# Patient Record
Sex: Male | Born: 1955 | Race: Black or African American | Hispanic: No | Marital: Married | Smoking: Former smoker
Health system: Southern US, Community
[De-identification: ages and names within clinical notes are randomized; demographics above are authoritative.]

## PROBLEM LIST (undated history)

## (undated) DIAGNOSIS — I10 Essential (primary) hypertension: Secondary | ICD-10-CM

## (undated) HISTORY — DX: Essential (primary) hypertension: I10

---

## 2000-08-12 ENCOUNTER — Emergency Department (HOSPITAL_COMMUNITY): Admission: EM | Admit: 2000-08-12 | Discharge: 2000-08-12 | Payer: Self-pay | Admitting: Emergency Medicine

## 2000-08-12 ENCOUNTER — Encounter: Payer: Self-pay | Admitting: Emergency Medicine

## 2001-06-28 ENCOUNTER — Emergency Department (HOSPITAL_COMMUNITY): Admission: EM | Admit: 2001-06-28 | Discharge: 2001-06-28 | Payer: Self-pay | Admitting: Emergency Medicine

## 2001-06-29 ENCOUNTER — Encounter: Payer: Self-pay | Admitting: Emergency Medicine

## 2006-06-03 ENCOUNTER — Emergency Department (HOSPITAL_COMMUNITY): Admission: EM | Admit: 2006-06-03 | Discharge: 2006-06-03 | Payer: Self-pay | Admitting: Emergency Medicine

## 2008-02-01 IMAGING — CR DG CHEST 1V PORT
1 series · 1 of 1 positions shown · non-contrast
Comparison: None

CLINICAL DATA: Shortness of breath, chest pain

PORTABLE CHEST - 1 VIEW:

[view not recorded]
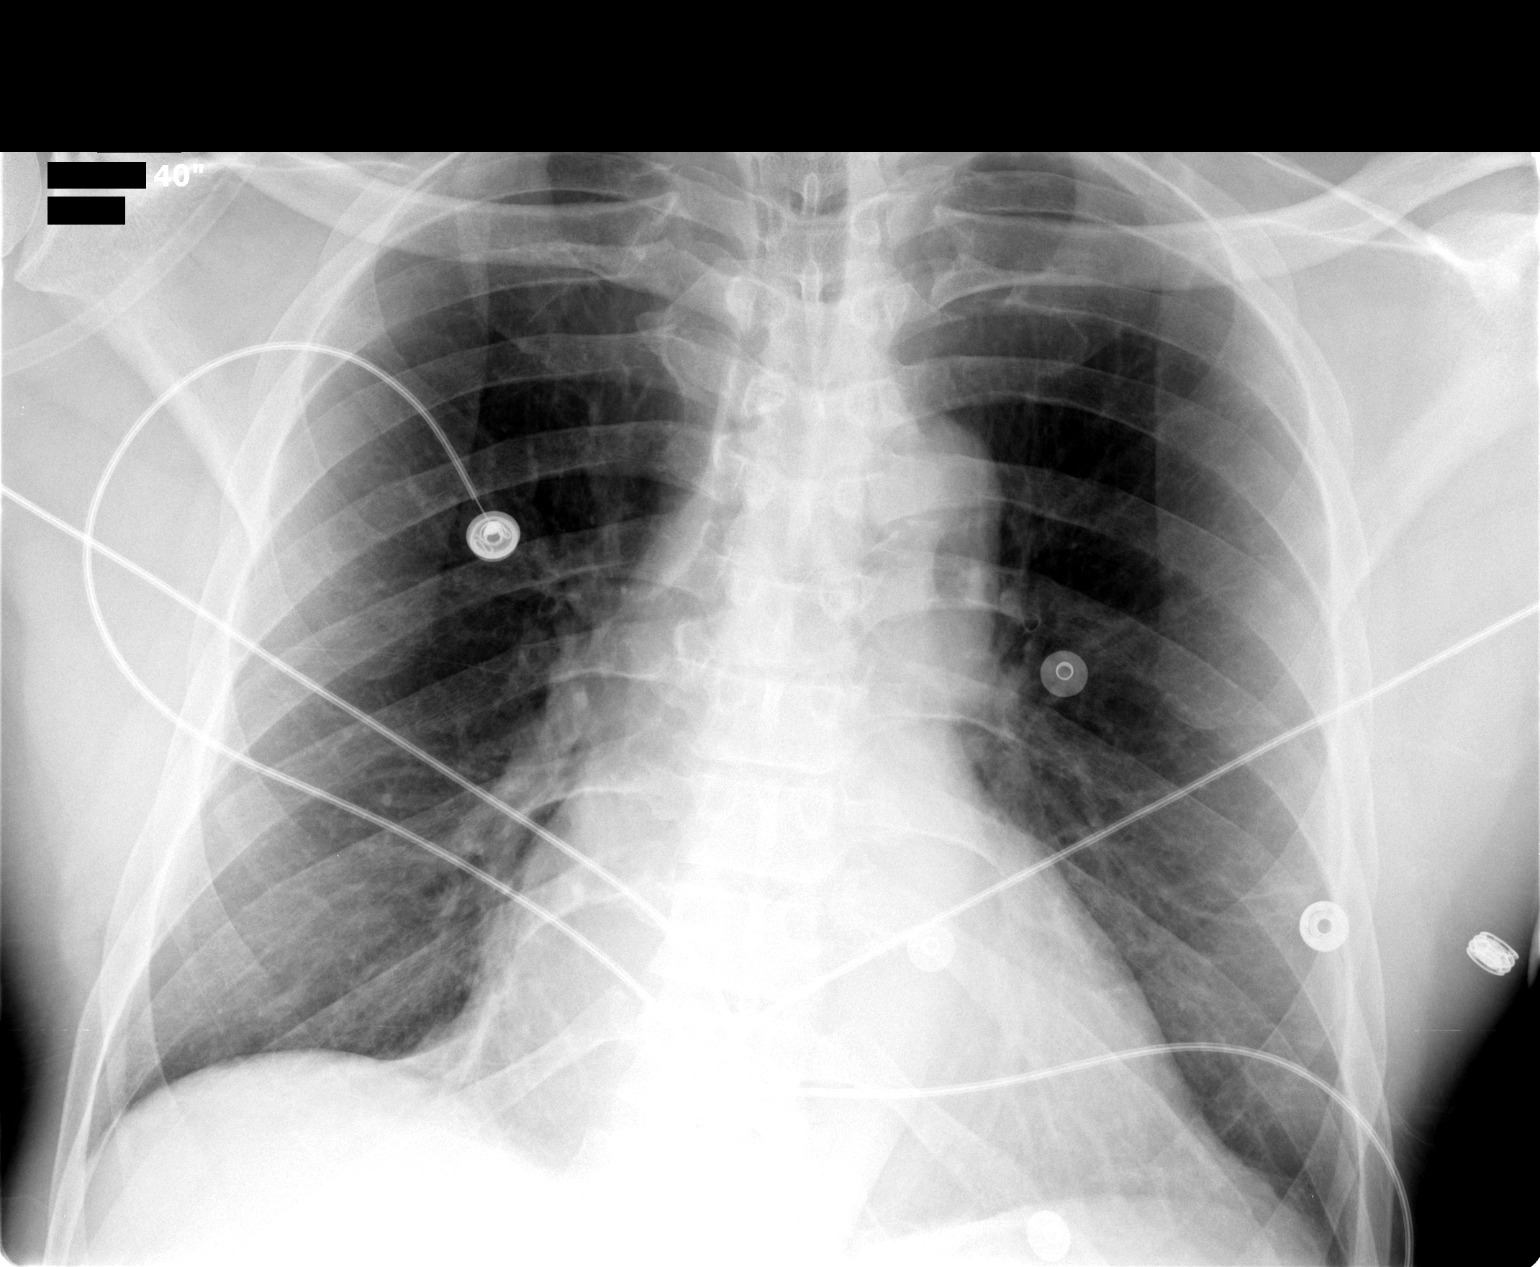

[1 of 1 positions shown; findings below may reference images not displayed]

FINDINGS: There is mild cardiomegaly. No focal airspace opacities or effusions.
Visualized skeleton unremarkable.
IMPRESSION: Mild cardiomegaly. No active disease.

## 2020-10-30 ENCOUNTER — Ambulatory Visit (HOSPITAL_COMMUNITY)
Admission: EM | Admit: 2020-10-30 | Discharge: 2020-10-30 | Disposition: A | Discharge status: Home / Self Care | Payer: Medicare Other | Attending: Family Medicine | Admitting: Family Medicine

## 2020-10-30 ENCOUNTER — Other Ambulatory Visit: Payer: Self-pay

## 2020-10-30 ENCOUNTER — Encounter (HOSPITAL_COMMUNITY): Payer: Self-pay | Admitting: Emergency Medicine

## 2020-10-30 DIAGNOSIS — S41111A Laceration without foreign body of right upper arm, initial encounter: Secondary | ICD-10-CM | POA: Diagnosis not present

## 2020-10-30 DIAGNOSIS — Z23 Encounter for immunization: Secondary | ICD-10-CM

## 2020-10-30 MED ORDER — TETANUS-DIPHTH-ACELL PERTUSSIS 5-2.5-18.5 LF-MCG/0.5 IM SUSY
PREFILLED_SYRINGE | INTRAMUSCULAR | Status: AC
  Filled 2020-10-30: qty 0.5

## 2020-10-30 MED ORDER — LIDOCAINE-EPINEPHRINE 1 %-1:100000 IJ SOLN
INTRAMUSCULAR | Status: AC
  Filled 2020-10-30: qty 1

## 2020-10-30 MED ORDER — TETANUS-DIPHTH-ACELL PERTUSSIS 5-2.5-18.5 LF-MCG/0.5 IM SUSY
0.5000 mL | PREFILLED_SYRINGE | Freq: Once | INTRAMUSCULAR | Status: AC
  Administered 2020-10-30: 0.5 mL via INTRAMUSCULAR

## 2020-10-30 NOTE — ED Triage Notes (Signed)
Pt presents with multiple lacerations on bilateral arms. States approx 1 hour ago, glass broke and cut both arms.   Pt states unsure when last Tdap was.

## 2020-10-30 NOTE — Discharge Instructions (Addendum)
Your sutures will absorb.  You do not need to have them removed.  You can use warm, soapy water to clean your wound.  If you see redness, pus, drainage, increased pain, or fever, you should be seen by a medical provider right away.  Your blood pressure was elevated today.  I recommend following up with your PCP.  If you have worsening headache, vision changes, numbness and tingling in your extremities, new back pain, difficulty urinating, or chest pain, you should be seen by a medical provider right away.

## 2020-10-30 NOTE — ED Provider Notes (Signed)
[facility]    CSN: [id number] Arrival date & time: 10/30/20  1548      History   Chief Complaint Chief Complaint  Patient presents with   Extremity Laceration    HPI Mark Novak is a 65 y.o. male.   Right forearm laceration Patient reports that he works for a [employer] and was lifting a piece of old fiberglass that shattered.  Any small pieces laceration on his forearm that is most concerning to him He reports that this occurred about an hour prior to arrival He is unsure of his last Tdap Denies N/T, loss of hand or finger function, fever He is not currently on any blood thinners  Patient denies a prior history of hypertension, not on any antihypertensives Denies headache, vision changes, chest pain, difficulty breathing, new back pain, difficulty urinating, numbness and tingling in his extremities, focal weakness   History reviewed. No pertinent past medical history.  There are no problems to display for this patient.   History reviewed. No pertinent surgical history.     Home Medications    Prior to Admission medications   Not on File    Family History History reviewed. No pertinent family history.  Social History Social History   Tobacco Use   Smoking status: Former    Types: Cigarettes   Smokeless tobacco: Never  Substance Use Topics   Alcohol use: Not Currently   Drug use: Not Currently     Allergies   Patient has no known allergies.   Review of Systems Review of Systems  Constitutional:  Negative for fever.  HENT:  Negative for congestion.   Eyes:  Negative for visual disturbance.  Respiratory:  Negative for shortness of breath.   Cardiovascular:  Negative for chest pain.  Genitourinary:  Negative for difficulty urinating.  Musculoskeletal:  Negative for back pain.  Skin:  Positive for wound.  Neurological:  Negative for dizziness, syncope, weakness and headaches.    Physical Exam Triage Vital Signs ED Triage Vitals   Enc Vitals Group     BP 10/30/20 1642 (!) 164/91     Pulse Rate 10/30/20 1642 88     Resp 10/30/20 1642 17     Temp 10/30/20 1642 98.4 F (36.9 C)     Temp Source 10/30/20 1642 Oral     SpO2 10/30/20 1642 100 %     Weight --      Height --      Head Circumference --      Peak Flow --      Pain Score 10/30/20 1638 5     Pain Loc --      Pain Edu? --      Excl. in GC? --    No data found.  Updated Vital Signs BP (!) 164/91 (BP Location: Left Arm)   Pulse 88   Temp 98.4 F (36.9 C) (Oral)   Resp 17   SpO2 100%   Visual Acuity Right Eye Distance:   Left Eye Distance:   Bilateral Distance:    Right Eye Near:   Left Eye Near:    Bilateral Near:     Physical Exam Constitutional:      General: He is not in acute distress.    Appearance: Normal appearance. He is not ill-appearing.  HENT:     Head: Normocephalic and atraumatic.  Cardiovascular:     Rate and Rhythm: Normal rate and regular rhythm.  Pulmonary:     Effort: Pulmonary effort is  normal. No respiratory distress.  Musculoskeletal:     Right upper arm: No bony tenderness.     Left upper arm: No bony tenderness.     Right elbow: No deformity. Normal range of motion.     Left elbow: No deformity. Normal range of motion.     Right wrist: Normal range of motion. Normal pulse.     Left wrist: Normal range of motion. Normal pulse.     Right hand: No bony tenderness. Normal range of motion. Normal strength. Normal pulse.     Left hand: No bony tenderness. Normal range of motion. Normal strength. Normal pulse.       Arms:  Neurological:     Mental Status: He is alert.     UC Treatments / Results  Labs (all labs ordered are listed, but only abnormal results are displayed) Labs Reviewed - No data to display  EKG   Radiology No results found.  Procedures Laceration Repair  Date/Time: 10/30/2020 5:32 PM Performed by: Unknown Jim, DO Authorized by: Merrilee Jansky, MD   Consent:     Consent obtained:  Verbal   Consent given by:  Patient   Risks, benefits, and alternatives were discussed: yes     Risks discussed:  Infection, pain, retained foreign body and poor cosmetic result   Alternatives discussed:  No treatment Universal protocol:    Patient identity confirmed:  Verbally with patient and arm band Anesthesia:    Anesthesia method:  Local infiltration   Local anesthetic:  Lidocaine 1% WITH epi Laceration details:    Location:  Shoulder/arm   Shoulder/arm location:  R lower arm   Length (cm):  0.5   Depth (mm):  1 Pre-procedure details:    Preparation:  Patient was prepped and draped in usual sterile fashion Exploration:    Wound exploration: entire depth of wound visualized     Wound extent: no fascia violation noted, no foreign bodies/material noted, no nerve damage noted and no tendon damage noted     Contaminated: no   Treatment:    Area cleansed with:  Saline   Amount of cleaning:  Standard   Irrigation solution:  Sterile saline   Irrigation method:  Syringe   Visualized foreign bodies/material removed: yes (two small pieces of glass)     Debridement:  None   Undermining:  None Skin repair:    Repair method:  Sutures   Suture size:  4-0   Wound skin closure material used: vicryl.   Suture technique:  Simple interrupted   Number of sutures:  2 Approximation:    Approximation:  Close Repair type:    Repair type:  Simple Post-procedure details:    Dressing:  Non-adherent dressing   Procedure completion:  Tolerated (including critical care time)  Medications Ordered in UC Medications  Tdap (BOOSTRIX) injection 0.5 mL (0.5 mLs Intramuscular Given 10/30/20 1725)    Initial Impression / Assessment and Plan / UC Course  I have reviewed the triage vital signs and the nursing notes.  Pertinent labs & imaging results that were available during my care of the patient were reviewed by me and considered in my medical decision making (see chart for  details).     Patient is a 65 yo male with few superficial abrasions and one laceration on right forearm extending to subcutaneous tissue which could have benefit from suture.  Discussed risk and benefits with patient and he would like to proceed with suturing of this laceration.  All  tendons of wrist and hands bilaterally are intact.  2 small pieces of glass removed from superficial lacerations in the right forearm.  Suturing performed per above.  Advised patient to monitor for signs of infection including fever, worsening pain, discharge, redness.  He was advised that there may be some glass left in his arm, but should improve with gentle cleansing and will eventually work its way out.  In regards to his hypertension, he is currently asymptomatic.  Recommended follow-up with his PCP.  ED precautions given if he becomes symptomatic.  He was given a Tdap in urgent care.  Discharged home.   Final Clinical Impressions(s) / UC Diagnoses   Final diagnoses:  Laceration of right upper extremity, initial encounter     Discharge Instructions      Your sutures will absorb.  You do not need to have them removed.  You can use warm, soapy water to clean your wound.  If you see redness, pus, drainage, increased pain, or fever, you should be seen by a medical provider right away.  Your blood pressure was elevated today.  I recommend following up with your PCP.  If you have worsening headache, vision changes, numbness and tingling in your extremities, new back pain, difficulty urinating, or chest pain, you should be seen by a medical provider right away.     ED Prescriptions   None    PDMP not reviewed this encounter.   Unknown Jim, DO 10/30/20 1736

## 2021-01-24 ENCOUNTER — Ambulatory Visit (INDEPENDENT_AMBULATORY_CARE_PROVIDER_SITE_OTHER): Payer: Medicare Other

## 2021-01-24 ENCOUNTER — Encounter (HOSPITAL_COMMUNITY): Payer: Self-pay | Admitting: Emergency Medicine

## 2021-01-24 ENCOUNTER — Other Ambulatory Visit: Payer: Self-pay

## 2021-01-24 ENCOUNTER — Ambulatory Visit (HOSPITAL_COMMUNITY)
Admission: EM | Admit: 2021-01-24 | Discharge: 2021-01-24 | Disposition: A | Discharge status: Home / Self Care | Payer: Medicare Other | Attending: Emergency Medicine | Admitting: Emergency Medicine

## 2021-01-24 DIAGNOSIS — S50851D Superficial foreign body of right forearm, subsequent encounter: Secondary | ICD-10-CM | POA: Diagnosis not present

## 2021-01-24 MED ORDER — IBUPROFEN 600 MG PO TABS: 600.0000 mg | ORAL_TABLET | Freq: Four times a day (QID) | ORAL | 0 refills | Status: DC | PRN

## 2021-01-24 NOTE — ED Triage Notes (Addendum)
Patient was seen in July 2022 for an injury to right arm  Today patient is in department and concerned about pain in right wrist and has a "suture",  remains exposed.  Has sharp pain in right wrist.

## 2021-01-24 NOTE — Discharge Instructions (Addendum)
I have put in referral to [place] [facility], however, give them a call ASAP and set up an appointment.  You have a shard of glass measuring 0.8 x 0.6 cm with surrounding soft tissue swelling.  This may work its way out, however, it may need to be removed if it continues to cause severe pain.  Below is a list of primary care practices who are taking new patients for you to follow-up with.  [facility] internal medicine clinic Ground Floor - [facility], [address] [place], [place], [place] [address] [phone]  [facility] Care at [facility] [address] [place], [place] [address] [phone]  [facility] [address] [place], [place] [address] [phone]  [facility] [phone] [address] [place] [address]  [facility]: [address] [place] [address]  [phone]  [facility] Family Medicine: [address] [place] [address]  [phone]  [facility] : [address] [place] [address] [phone]  [facility] Primary Care: [address] [place] [address] [phone]  [facility]: [address] [place] [address] [phone]  Dr. Oneal Grout [address] [facility] [place] [address]  [phone]  Go to www.goodrx.com  or www.costplusdrugs.com to look up your medications. This will give you a list of where you can find your prescriptions at the most affordable prices. Or ask the pharmacist what the cash price is, or if they have any other discount programs available to help make your medication more affordable. This can be less expensive than what you would pay with insurance.

## 2021-01-24 NOTE — ED Provider Notes (Signed)
HPI  SUBJECTIVE:  Mark Novak is a right-handed 65 y.o. male who presents with persistent pain, swelling, foreign body sensation in his right forearm since sustaining a laceration in July with some glass. Patient was seen here on 7/19 for this.  2 small pieces of glass were removed from largest laceration.  It was closed with 2 dissolvable sutures.  He states that his forearm at the laceration site has been sore since the incident, and he reports pain with wrist flexion/extension.  He states that he is having less pain at the scar site, but it is present with palpation.  No erythema, purulent drainage.  No fevers.  No alleviating factors.  He has not tried anything for this.  Symptoms worse with palpation.  He has no past medical history.  PMD: None.  History reviewed. No pertinent past medical history.  History reviewed. No pertinent surgical history.  History reviewed. No pertinent family history.  Social History   Tobacco Use   Smoking status: Former    Types: Cigarettes   Smokeless tobacco: Never  Vaping Use   Vaping Use: Never used  Substance Use Topics   Alcohol use: Not Currently   Drug use: Not Currently    No current facility-administered medications for this encounter.  Current Outpatient Medications:    ibuprofen (ADVIL) 600 MG tablet, Take 1 tablet (600 mg total) by mouth every 6 (six) hours as needed., Disp: 30 tablet, Rfl: 0   Multiple Vitamin (MULTIVITAMIN) capsule, Take 1 capsule by mouth daily., Disp: , Rfl:   No Known Allergies   ROS  As noted in HPI.   Physical Exam  BP (!) 148/90 (BP Location: Left Arm)   Pulse 70   Temp 98 F (36.7 C) (Oral)   Resp 20   SpO2 99%   Constitutional: Well developed, well nourished, no acute distress Eyes:  EOMI, conjunctiva normal bilaterally HENT: Normocephalic, atraumatic,mucus membranes moist Respiratory: Normal inspiratory effort Cardiovascular: Normal rate GI: nondistended skin: Healed, tender, hyperpigmented  scar with a single suture protruding from the surface distal right upper extremity.  No expressible purulent drainage.  No erythema.  Positive tender surrounding hard tissue.  No central fluctuance.      Musculoskeletal: no deformities.  Pain with flexion extension of the wrist.  No pain with radial/ulnar deviation, supination/pronation.  RP 2+.  Sensation distally grossly intact. Neurologic: Alert & oriented x 3, no focal neuro deficits Psychiatric: Speech and behavior appropriate   ED Course   Medications - No data to display  Orders Placed This Encounter  Procedures   DG Forearm Right    Standing Status:   Standing    Number of Occurrences:   1    Order Specific Question:   Reason for Exam (SYMPTOM  OR DIAGNOSIS REQUIRED)    Answer:   Laceration with glass in June distal forearm, 2 shards removed.  Rule out retained foreign body.   Ambulatory referral to General Surgery    Referral Priority:   Routine    Referral Type:   Surgical    Referral Reason:   Specialty Services Required    Referred to Provider:   [facility]    Requested Specialty:   General Surgery    Number of Visits Requested:   1    No results found for this or any previous visit (from the past 24 hour(s)). DG Forearm Right  Result Date: 01/24/2021 CLINICAL DATA:  Laceration with glass in June distal forearm, 2 shards removed. Rule  out retained foreign body. EXAM: RIGHT FOREARM - 2 VIEW COMPARISON:  None. FINDINGS: There is no evidence of acute fracture. There is a radiopaque density overlying the volar lateral soft tissues of the distal forearm which measures 0.8 x 0.6 cm. Adjacent soft tissue swelling. Mild elbow arthritis. Mild wrist arthritis. IMPRESSION: Radiopaque density measuring 0.8 x 0.6 cm overlying the volar lateral soft tissues of the distal forearm, consistent with foreign body. Adjacent soft tissue swelling. Electronically Signed   By: Caprice Renshaw M.D.   On: 01/24/2021 12:27    ED  Clinical Impression  1. Foreign body in right forearm, subsequent encounter      ED Assessment/Plan  Previous records reviewed.  As noted in HPI.  Removed protruding suture in its entirety with fingers.  There is no expressible purulent drainage.  He has tender hard mass surrounding the scar.  Suspect scarring versus cyst.  Concern for retained foreign body.  Will x-ray right forearm to rule out any retained foreign body.  Discussed with patient and wife that glass may not show up on x-ray.  Discussed with patient and spouse that usually, foreign bodies work their way out and that removal is not indicated unless it is causing significant pain/disability.  Reviewed imaging independently.  Radiopaque density measuring 0.8 x 0.6 cm with adjacent soft tissue swelling. sere radiology report for full details.  Will refer to surgery for definitive evaluation and treatment.  Can try Tylenol/ibuprofen as needed for pain.  Also providing primary care list for ongoing routine care.  Ordering assistance in finding a PMD.  Discussed  imaging, MDM, treatment plan, and plan for follow-up with patient. Discussed sn/sx that should prompt return to the ED. patient agrees with plan.   Meds ordered this encounter  Medications   ibuprofen (ADVIL) 600 MG tablet    Sig: Take 1 tablet (600 mg total) by mouth every 6 (six) hours as needed.    Dispense:  30 tablet    Refill:  0       *This clinic note was created using Scientist, clinical (histocompatibility and immunogenetics). Therefore, there may be occasional mistakes despite careful proofreading.  ?    Domenick Gong, MD 01/25/21 [phone]

## 2021-05-12 ENCOUNTER — Other Ambulatory Visit: Payer: Self-pay

## 2021-05-12 ENCOUNTER — Ambulatory Visit (HOSPITAL_COMMUNITY)
Admission: EM | Admit: 2021-05-12 | Discharge: 2021-05-12 | Disposition: A | Discharge status: Home / Self Care | Payer: Medicare PPO | Attending: Family Medicine | Admitting: Family Medicine

## 2021-05-12 ENCOUNTER — Encounter (HOSPITAL_COMMUNITY): Payer: Self-pay | Admitting: *Deleted

## 2021-05-12 DIAGNOSIS — I10 Essential (primary) hypertension: Secondary | ICD-10-CM | POA: Diagnosis not present

## 2021-05-12 DIAGNOSIS — M542 Cervicalgia: Secondary | ICD-10-CM

## 2021-05-12 MED ORDER — LISINOPRIL 10 MG PO TABS: 10.0000 mg | ORAL_TABLET | Freq: Every day | ORAL | 0 refills | Status: DC

## 2021-05-12 MED ORDER — TIZANIDINE HCL 4 MG PO TABS: 4.0000 mg | ORAL_TABLET | Freq: Three times a day (TID) | ORAL | 0 refills | Status: DC | PRN

## 2021-05-12 MED ORDER — IBUPROFEN 800 MG PO TABS: 800.0000 mg | ORAL_TABLET | Freq: Three times a day (TID) | ORAL | 0 refills | Status: DC | PRN

## 2021-05-12 NOTE — ED Triage Notes (Signed)
Pt reports at church today he had his BP checked and it was 168/91. Pt is not on BP meds . Pt does not have a PCP.

## 2021-05-12 NOTE — Discharge Instructions (Addendum)
Lisinopril 10 mg 1 daily for blood pressure.  This medicine can cause a dry cough.  Take ibuprofen 800 mg 1 every 8 hours as needed for pain.  You can also take tizanidine 4 mg 1 every 8 hours as needed for muscle spasm and muscle pain.  This medication can make you sleepy   Goal for blood pressure treatment is to have the blood pressure be less than 140 on the top and less than 90 on the bottom

## 2021-05-12 NOTE — ED Provider Notes (Signed)
[facility]    CSN: [id number] Arrival date & time: 05/12/21  1201      History   Chief Complaint Chief Complaint  Patient presents with   Hypertension    HPI Mark Novak is a 66 y.o. male.    Hypertension  Here for elevated blood pressure noted today when his blood pressure was checked at church.  Someone checked his blood pressure because he was having some right-sided chest and neck and shoulder pain.  The blood pressure there was in the 160s over 90s. Here it is 162/78.  He has not had much headache.  The right-sided shoulder pain has been bothering him in the last 2 or 3 days.  It hurts worse when he moves his arm about his shoulder or moves his head and turns his head.  No fever or chills.  He reports his blood pressure was elevated when he was here previously for an injury.  He does have some numbness in his right fourth and fifth digits of the hand.  He expresses concern about the retained glass in his right forearm since the injury in July 2022.    History reviewed. No pertinent past medical history.  There are no problems to display for this patient.   History reviewed. No pertinent surgical history.     Home Medications    Prior to Admission medications   Medication Sig Start Date End Date Taking? Authorizing Provider  ibuprofen (ADVIL) 800 MG tablet Take 1 tablet (800 mg total) by mouth every 8 (eight) hours as needed (pain). 05/12/21  Yes Zenia ResidesBanister, Myla Mauriello K, MD  lisinopril (ZESTRIL) 10 MG tablet Take 1 tablet (10 mg total) by mouth daily. 05/12/21  Yes Eilam Shrewsbury, Janace ArisPamela K, MD  tiZANidine (ZANAFLEX) 4 MG tablet Take 1 tablet (4 mg total) by mouth every 8 (eight) hours as needed for muscle spasms. 05/12/21  Yes Zenia ResidesBanister, Arrie Borrelli K, MD  Multiple Vitamin (MULTIVITAMIN) capsule Take 1 capsule by mouth daily.    [provider]    Family History History reviewed. No pertinent family history.  Social History Social History   Tobacco Use    Smoking status: Former    Types: Cigarettes   Smokeless tobacco: Never  Vaping Use   Vaping Use: Never used  Substance Use Topics   Alcohol use: Not Currently   Drug use: Not Currently     Allergies   Patient has no known allergies.   Review of Systems Review of Systems   Physical Exam Triage Vital Signs ED Triage Vitals  Enc Vitals Group     BP 05/12/21 1245 (!) 162/78     Pulse Rate 05/12/21 1245 70     Resp 05/12/21 1245 18     Temp 05/12/21 1245 98.2 F (36.8 C)     Temp src --      SpO2 05/12/21 1245 96 %     Weight --      Height --      Head Circumference --      Peak Flow --      Pain Score 05/12/21 1243 10     Pain Loc --      Pain Edu? --      Excl. in GC? --    No data found.  Updated Vital Signs BP (!) 162/78    Pulse 70    Temp 98.2 F (36.8 C)    Resp 18    SpO2 96%   Visual Acuity Right Eye Distance:  Left Eye Distance:   Bilateral Distance:    Right Eye Near:   Left Eye Near:    Bilateral Near:     Physical Exam Vitals reviewed.  Constitutional:      General: He is not in acute distress.    Appearance: He is not toxic-appearing.  Eyes:     Extraocular Movements: Extraocular movements intact.     Pupils: Pupils are equal, round, and reactive to light.  Cardiovascular:     Rate and Rhythm: Normal rate and regular rhythm.     Heart sounds: No murmur heard. Pulmonary:     Effort: Pulmonary effort is normal.     Breath sounds: Normal breath sounds.  Musculoskeletal:     Cervical back: Neck supple. Tenderness (right trap) present.  Lymphadenopathy:     Cervical: No cervical adenopathy.  Skin:    Capillary Refill: Capillary refill takes less than 2 seconds.     Coloration: Skin is not jaundiced or pale.  Neurological:     General: No focal deficit present.     Mental Status: He is alert.  Psychiatric:        Behavior: Behavior normal.     UC Treatments / Results  Labs (all labs ordered are listed, but only abnormal results  are displayed) Labs Reviewed - No data to display  EKG   Radiology No results found.  Procedures Procedures (including critical care time)  Medications Ordered in UC Medications - No data to display  Initial Impression / Assessment and Plan / UC Course  I have reviewed the triage vital signs and the nursing notes.  Pertinent labs & imaging results that were available during my care of the patient were reviewed by me and considered in my medical decision making (see chart for details).     Discussed that his blood pressure could be elevated at least in part to his pain.  I will begin lisinopril 10 mg.  Discussed at some length that I think his symptoms are related to his neck and his muscle pain.  Discussed also that I would recommend he leave the glass alone, but he feels that it hurts him and causes numbness and tingling when he accidentally hits it when he is doing his work.  He does not have a PCP; I will give him information on finding 1 Final Clinical Impressions(s) / UC Diagnoses   Final diagnoses:  Essential hypertension  Neck pain on right side     Discharge Instructions      Lisinopril 10 mg 1 daily for blood pressure.  This medicine can cause a dry cough.  Take ibuprofen 800 mg 1 every 8 hours as needed for pain.  You can also take tizanidine 4 mg 1 every 8 hours as needed for muscle spasm and muscle pain.  This medication can make you sleepy   Goal for blood pressure treatment is to have the blood pressure be less than 140 on the top and less than 90 on the bottom     ED Prescriptions     Medication Sig Dispense Auth. Provider   ibuprofen (ADVIL) 800 MG tablet Take 1 tablet (800 mg total) by mouth every 8 (eight) hours as needed (pain). 21 tablet Chantalle Defilippo, Janace Aris, MD   tiZANidine (ZANAFLEX) 4 MG tablet Take 1 tablet (4 mg total) by mouth every 8 (eight) hours as needed for muscle spasms. 30 tablet Zenia Resides, MD   lisinopril (ZESTRIL) 10 MG tablet  Take 1 tablet (10  mg total) by mouth daily. 30 tablet Natayla Cadenhead, Janace Aris, MD      PDMP not reviewed this encounter.   Zenia Resides, MD 05/12/21 1321

## 2021-05-23 ENCOUNTER — Ambulatory Visit
Admission: RE | Admit: 2021-05-23 | Discharge: 2021-05-23 | Disposition: A | Discharge status: Home / Self Care | Payer: Medicare PPO | Source: Ambulatory Visit | Attending: Internal Medicine | Admitting: Internal Medicine

## 2021-05-23 ENCOUNTER — Other Ambulatory Visit: Payer: Self-pay | Admitting: Internal Medicine

## 2021-05-23 DIAGNOSIS — R0609 Other forms of dyspnea: Secondary | ICD-10-CM

## 2021-06-07 ENCOUNTER — Institutional Professional Consult (permissible substitution): Payer: Medicare PPO | Admitting: Pulmonary Disease

## 2021-06-10 ENCOUNTER — Other Ambulatory Visit: Payer: Self-pay

## 2021-06-10 ENCOUNTER — Encounter: Payer: Self-pay | Admitting: Cardiology

## 2021-06-10 ENCOUNTER — Ambulatory Visit: Payer: Medicare PPO | Admitting: Cardiology

## 2021-06-10 VITALS — BP 144/81 | HR 71 | Temp 96.7°F | Resp 17 | Ht 79.0 in | Wt 242.4 lb

## 2021-06-10 DIAGNOSIS — I10 Essential (primary) hypertension: Secondary | ICD-10-CM

## 2021-06-10 DIAGNOSIS — R0609 Other forms of dyspnea: Secondary | ICD-10-CM

## 2021-06-10 DIAGNOSIS — R0683 Snoring: Secondary | ICD-10-CM

## 2021-06-10 DIAGNOSIS — E782 Mixed hyperlipidemia: Secondary | ICD-10-CM

## 2021-06-10 MED ORDER — ROSUVASTATIN CALCIUM 10 MG PO TABS: 20.0000 mg | ORAL_TABLET | Freq: Every day | ORAL | 0 refills | Status: DC

## 2021-06-10 NOTE — Progress Notes (Signed)
Patient referred by Audley Hose, MD for dyspnea on exertion, murmur  Subjective:   Mark Novak, male    DOB: 07/24/1955, 66 y.o.   MRN: [id number]   Chief Complaint  Patient presents with   New Patient (Initial Visit)   Heart Murmur   DOE       HPI  66 y.o. African American male with hypertension, hyperlipidemia, mitral valve, referred for dyspnea on exertion, murmur  Patient is here with his wife today. He runs a glassware business. He has noticed that he gets winded more easily than before.  He denies any chest pain, PND, edema symptoms.  He has been told to have cardiac murmur in the past.  Personally reviewed and independently interpreted external labs.   Past Medical History:  Diagnosis Date   Hypertension      History reviewed. No pertinent surgical history.   Social History   Tobacco Use  Smoking Status Former   Types: Cigarettes  Smokeless Tobacco Never    Social History   Substance and Sexual Activity  Alcohol Use Not Currently     Family History  Problem Relation Age of Onset   Dementia Mother       Current Outpatient Medications:    ibuprofen (ADVIL) 800 MG tablet, Take 1 tablet (800 mg total) by mouth every 8 (eight) hours as needed (pain)., Disp: 21 tablet, Rfl: 0   lisinopril (ZESTRIL) 10 MG tablet, Take 1 tablet (10 mg total) by mouth daily., Disp: 30 tablet, Rfl: 0   Multiple Vitamin (MULTIVITAMIN) capsule, Take 1 capsule by mouth daily., Disp: , Rfl:    tiZANidine (ZANAFLEX) 4 MG tablet, Take 1 tablet (4 mg total) by mouth every 8 (eight) hours as needed for muscle spasms., Disp: 30 tablet, Rfl: 0   Cardiovascular and other pertinent studies:  Reviewed external labs and tests, independently interpreted  EKG 06/10/2021: Sinus rhythm 70 bpm Occasional PAC     Recent labs: 05/23/2021: Glucose 105, BUN/Cr 13/1.24. EGFR 64. Na/K 139/4.1. Rest of the CMP normal H/H 13/39. MCV 86. Platelets 171 HbA1C 6.2% Chol 238, TG 90,  HDL 49, LDL 169 TSH 2.1 normal    Review of Systems  Cardiovascular:  Positive for dyspnea on exertion. Negative for chest pain, leg swelling, palpitations and syncope.        Vitals:   06/10/21 1034 06/10/21 1035  BP: (!) 155/82 (!) 144/81  Pulse: 79 71  Resp: 17   Temp: (!) 96.7 F (35.9 C)   SpO2: 100% 99%    Orthostatic vital Body mass index is 27.31 kg/m. Filed Weights   06/10/21 1034  Weight: 242 lb 6.4 oz (110 kg)     Objective:   Physical Exam Vitals and nursing note reviewed.  Constitutional:      General: He is not in acute distress. Neck:     Vascular: No JVD.  Cardiovascular:     Rate and Rhythm: Normal rate and regular rhythm.     Heart sounds: Murmur heard.  High-pitched blowing mid to late systolic murmur is present with a grade of 3/6 at the apex.  Pulmonary:     Effort: Pulmonary effort is normal.     Breath sounds: Normal breath sounds. No wheezing or rales.  Musculoskeletal:     Right lower leg: No edema.     Left lower leg: No edema.         Visit diagnoses:   ICD-10-CM   1. DOE (dyspnea on exertion)  R06.09 EKG 12-Lead       Orders Placed This Encounter  Procedures   Lipid panel   Ambulatory referral to Sleep Studies   EKG 12-Lead   PCV ECHOCARDIOGRAM COMPLETE    Medication changes this visit: Meds ordered this encounter  Medications   rosuvastatin (CRESTOR) 10 MG tablet    Sig: Take 2 tablets (20 mg total) by mouth daily.    Dispense:  1 tablet    Refill:  0     Assessment & Recommendations:   66 y.o. African American male with hypertension, hyperlipidemia, mitral valve, referred for dyspnea on exertion, murmur  Dyspnea exertion: Abnormal suggestive of mitral regurgitation. Check echocardiogram.  Based on echocardiogram, I will make further recommendations.  Hypertension: Suboptimal control.  Strong history suggestive of obstructive sleep apnea. Refer for sleep study. Continue current antihypertensive  therapy.  Hyperlipidemia: LDL 169 mg on Crestor 10 mg daily.  Increase Crestor to 20 mg daily. Repeat lipid panel in 3 months.   Thank you for referring the patient to [place]. Please feel free to contact with any questions.   [name], MD Pager: [phone] Office: [phone]

## 2021-06-11 ENCOUNTER — Encounter: Payer: Self-pay | Admitting: Pulmonary Disease

## 2021-06-11 ENCOUNTER — Ambulatory Visit: Payer: Medicare PPO

## 2021-06-11 ENCOUNTER — Ambulatory Visit: Payer: Medicare PPO | Admitting: Pulmonary Disease

## 2021-06-11 VITALS — BP 128/66 | HR 74 | Temp 97.7°F | Ht 79.0 in | Wt 240.0 lb

## 2021-06-11 DIAGNOSIS — R0602 Shortness of breath: Secondary | ICD-10-CM | POA: Diagnosis not present

## 2021-06-11 DIAGNOSIS — Z87891 Personal history of nicotine dependence: Secondary | ICD-10-CM

## 2021-06-11 DIAGNOSIS — I10 Essential (primary) hypertension: Secondary | ICD-10-CM

## 2021-06-11 LAB — COMPREHENSIVE METABOLIC PANEL
ALT: 19 U/L (ref 0–53)
AST: 21 U/L (ref 0–37)
Albumin: 4.8 g/dL (ref 3.5–5.2)
Alkaline Phosphatase: 57 U/L (ref 39–117)
BUN: 10 mg/dL (ref 6–23)
CO2: 28 mEq/L (ref 19–32)
Calcium: 10.3 mg/dL (ref 8.4–10.5)
Chloride: 104 mEq/L (ref 96–112)
Creatinine, Ser: 1.17 mg/dL (ref 0.40–1.50)
GFR: 64.44 mL/min (ref >60.00)
Glucose, Bld: 118 mg/dL — ABNORMAL HIGH (ref 70–99)
Potassium: 3.7 mEq/L (ref 3.5–5.1)
Sodium: 140 mEq/L (ref 135–145)
Total Bilirubin: 0.4 mg/dL (ref 0.2–1.2)
Total Protein: 7.4 g/dL (ref 6.0–8.3)

## 2021-06-11 LAB — CBC WITH DIFFERENTIAL/PLATELET
Basophils Absolute: 0 10*3/uL (ref 0.0–0.1)
Basophils Relative: 1.1 % (ref 0.0–3.0)
Eosinophils Absolute: 0.2 10*3/uL (ref 0.0–0.7)
Eosinophils Relative: 4 % (ref 0.0–5.0)
HCT: 35.7 % — ABNORMAL LOW (ref 39.0–52.0)
Hemoglobin: 12.3 g/dL — ABNORMAL LOW (ref 13.0–17.0)
Lymphocytes Relative: 28.6 % (ref 12.0–46.0)
Lymphs Abs: 1.3 10*3/uL (ref 0.7–4.0)
MCHC: 34.4 g/dL (ref 30.0–36.0)
MCV: 85.8 fl (ref 78.0–100.0)
Monocytes Absolute: 0.3 10*3/uL (ref 0.1–1.0)
Monocytes Relative: 7.8 % (ref 3.0–12.0)
Neutro Abs: 2.6 10*3/uL (ref 1.4–7.7)
Neutrophils Relative %: 58.5 % (ref 43.0–77.0)
Platelets: 169 10*3/uL (ref 150.0–400.0)
RBC: 4.16 Mil/uL — ABNORMAL LOW (ref 4.22–5.81)
RDW: 14.7 % (ref 11.5–15.5)
WBC: 4.4 10*3/uL (ref 4.0–10.5)

## 2021-06-11 MED ORDER — TRELEGY ELLIPTA 200-62.5-25 MCG/ACT IN AEPB: 1.0000 | INHALATION_SPRAY | Freq: Every day | RESPIRATORY_TRACT | 0 refills | Status: AC

## 2021-06-11 MED ORDER — TRELEGY ELLIPTA 200-62.5-25 MCG/ACT IN AEPB: 1.0000 | INHALATION_SPRAY | Freq: Every day | RESPIRATORY_TRACT | 2 refills | Status: DC

## 2021-06-11 NOTE — Patient Instructions (Signed)
We will get some labs today including CBC differential, IgE, alpha-1 antitrypsin levels and phenotype and CMP Start Trelegy 200 inhaler Schedule PFTs and referral for low-dose screening CT chest  Follow-up in 2 to 3 months

## 2021-06-11 NOTE — Progress Notes (Signed)
Mark Novak    VB:[id number]    [birth year]  Primary Care Physician:Bakare, Larey Dresser, MD  Referring Physician: Audley Hose, MD [name],  [name] [id number]  Chief complaint: Consult for dyspnea  HPI: 66 year old with hypertension, hyperlipidemia, heart murmur Complains of dyspnea which has been going on for 2 years.  He has symptoms with rest and exertion.  Associated with chest tightness and occasional wheeze.  He recently saw Dr. Virgina Jock, cardiology and had an echocardiogram for evaluation of murmur and sleep study ordered due to symptoms of snoring and witnessed apneas at night  Pets: Dogs Occupation: Installs glass in houses Exposures: No mold, hot tub, Jacuzzi.  No feather pillows or comforters Smoking history: 25-pack-year smoker.  Quit in 2021 Travel history: Originally from [place].  No significant travel history Relevant family history: Son has asthma  Outpatient Encounter Medications as of 06/11/2021  Medication Sig   albuterol (VENTOLIN HFA) 108 (90 Base) MCG/ACT inhaler Inhale 1 puff into the lungs daily as needed.   ibuprofen (ADVIL) 800 MG tablet Take 1 tablet (800 mg total) by mouth every 8 (eight) hours as needed (pain).   losartan-hydrochlorothiazide (HYZAAR) 100-12.5 MG tablet Take 1 tablet by mouth daily.   Multiple Vitamin (MULTIVITAMIN) capsule Take 1 capsule by mouth daily.   rosuvastatin (CRESTOR) 10 MG tablet Take 2 tablets (20 mg total) by mouth daily.   No facility-administered encounter medications on file as of 06/11/2021.    Allergies as of 06/11/2021   (No Known Allergies)    Past Medical History:  Diagnosis Date   Hypertension     No past surgical history on file.  Family History  Problem Relation Age of Onset   Dementia Mother     Social History   Socioeconomic History   Marital status: Married    Spouse name: Not on file   Number of children: 1   Years of education: Not on file    Highest education level: Not on file  Occupational History   Not on file  Tobacco Use   Smoking status: Former    Packs/day: 0.25    Years: 50.00    Pack years: 12.50    Types: Cigarettes    Quit date: 2020    Years since quitting: 3.1    Passive exposure: Past   Smokeless tobacco: Never  Vaping Use   Vaping Use: Never used  Substance and Sexual Activity   Alcohol use: Yes    Comment: does not drink currently but did 20 years ago   Drug use: Not Currently    Types: "Crack" cocaine, Cocaine    Comment: heroin ABOUT 20 YEARS AGO   Sexual activity: Not on file  Other Topics Concern   Not on file  Social History Narrative   Not on file   Social Determinants of Health   Financial Resource Strain: Not on file  Food Insecurity: Not on file  Transportation Needs: Not on file  Physical Activity: Not on file  Stress: Not on file  Social Connections: Not on file  Intimate Partner Violence: Not on file    Review of systems: Review of Systems  Constitutional: Negative for fever and chills.  HENT: Negative.   Eyes: Negative for blurred vision.  Respiratory: as per HPI  Cardiovascular: Negative for chest pain and palpitations.  Gastrointestinal: Negative for vomiting, diarrhea, blood per rectum. Genitourinary: Negative for dysuria, urgency, frequency and hematuria.  Musculoskeletal: Negative  for myalgias, back pain and joint pain.  Skin: Negative for itching and rash.  Neurological: Negative for dizziness, tremors, focal weakness, seizures and loss of consciousness.  Endo/Heme/Allergies: Negative for environmental allergies.  Psychiatric/Behavioral: Negative for depression, suicidal ideas and hallucinations.  All other systems reviewed and are negative.  Physical Exam: Blood pressure 128/66, pulse 74, temperature 97.7 F (36.5 C), temperature source Oral, height 6\' 7"  (2.007 m), weight 240 lb (108.9 kg), SpO2 100 %. Gen:      No acute distress HEENT:  EOMI, sclera  anicteric Neck:     No masses; no thyromegaly Lungs:    Clear to auscultation bilaterally; normal respiratory effort CV:         Regular rate and rhythm; no murmurs Abd:      + bowel sounds; soft, non-tender; no palpable masses, no distension Ext:    No edema; adequate peripheral perfusion Skin:      Warm and dry; no rash Neuro: alert and oriented x 3 Psych: normal mood and affect  Data Reviewed: Imaging: Chest x-ray 05/24/2021-no active cardiopulmonary disease.  I have reviewed the images personally.  PFTs:  Labs:  Cardiology: Echocardiogram 06/11/2021 Official read is pending  Assessment:  Assessment for dyspnea May have COPD or asthma given history of smoking and seasonal allergies We will trial Trelegy inhaler Check labs including CBC differential, IgE, alpha-1 antitrypsin levels and phenotype, metabolic panel Schedule PFTs  Suspected apnea Sleep study already ordered by cardiology  Ex-smoker Refer for low-dose screening CT chest  Plan/Recommendations: Labs, PFTs Trelegy inhaler Low-dose screening CT chest  Marshell Garfinkel MD [facility] 06/11/2021, 11:19 AM  CC: Audley Hose, MD

## 2021-06-12 LAB — IGE: IgE (Immunoglobulin E), Serum: 158 kU/L — ABNORMAL HIGH (ref <=114)

## 2021-06-18 ENCOUNTER — Other Ambulatory Visit: Payer: Self-pay | Admitting: Internal Medicine

## 2021-06-18 DIAGNOSIS — Z136 Encounter for screening for cardiovascular disorders: Secondary | ICD-10-CM

## 2021-06-19 ENCOUNTER — Other Ambulatory Visit: Payer: Self-pay | Admitting: Internal Medicine

## 2021-06-19 DIAGNOSIS — Z122 Encounter for screening for malignant neoplasm of respiratory organs: Secondary | ICD-10-CM

## 2021-06-21 LAB — ALPHA-1 ANTITRYPSIN PHENOTYPE: A-1 Antitrypsin, Ser: 134 mg/dL (ref 83–199)

## 2021-06-24 ENCOUNTER — Ambulatory Visit
Admission: RE | Admit: 2021-06-24 | Discharge: 2021-06-24 | Disposition: A | Discharge status: Home / Self Care | Payer: Medicare PPO | Source: Ambulatory Visit | Attending: Internal Medicine | Admitting: Internal Medicine

## 2021-06-24 DIAGNOSIS — Z136 Encounter for screening for cardiovascular disorders: Secondary | ICD-10-CM

## 2021-07-11 ENCOUNTER — Ambulatory Visit
Admission: RE | Admit: 2021-07-11 | Discharge: 2021-07-11 | Disposition: A | Discharge status: Home / Self Care | Payer: Medicare PPO | Source: Ambulatory Visit | Attending: Internal Medicine | Admitting: Internal Medicine

## 2021-07-11 DIAGNOSIS — Z122 Encounter for screening for malignant neoplasm of respiratory organs: Secondary | ICD-10-CM

## 2021-07-15 ENCOUNTER — Ambulatory Visit: Payer: Medicare PPO | Admitting: Cardiology

## 2021-07-15 DIAGNOSIS — I340 Nonrheumatic mitral (valve) insufficiency: Secondary | ICD-10-CM | POA: Insufficient documentation

## 2021-07-15 NOTE — Progress Notes (Signed)
Error

## 2021-07-25 ENCOUNTER — Encounter: Payer: Self-pay | Admitting: Cardiology

## 2021-07-25 ENCOUNTER — Ambulatory Visit: Payer: Medicare PPO | Admitting: Cardiology

## 2021-07-25 VITALS — BP 149/79 | HR 73 | Temp 98.0°F | Resp 16 | Ht 79.0 in | Wt 239.0 lb

## 2021-07-25 DIAGNOSIS — R0609 Other forms of dyspnea: Secondary | ICD-10-CM | POA: Insufficient documentation

## 2021-07-25 DIAGNOSIS — I340 Nonrheumatic mitral (valve) insufficiency: Secondary | ICD-10-CM

## 2021-07-25 DIAGNOSIS — I10 Essential (primary) hypertension: Secondary | ICD-10-CM | POA: Insufficient documentation

## 2021-07-25 DIAGNOSIS — E782 Mixed hyperlipidemia: Secondary | ICD-10-CM | POA: Insufficient documentation

## 2021-07-25 MED ORDER — ROSUVASTATIN CALCIUM 40 MG PO TABS: 40.0000 mg | ORAL_TABLET | Freq: Every day | ORAL | 3 refills | Status: DC

## 2021-07-25 NOTE — Progress Notes (Signed)
? ? ?Patient referred by Audley Hose, MD for dyspnea on exertion, murmur ? ?Subjective:  ? ?Mark Novak, male    DOB: 09/16/1955, 66 y.o.   MRN: [id number] ? ? ?Chief Complaint  ?Patient presents with  ? Nonrheumatic mitral valve regurgitation  ? Follow-up  ?  4 WEEKS  ? ? ? ?HPI ? ?66 y.o. African American male with hypertension, hyperlipidemia, mitral valve prolapse, LAD calcification. ? ?Exertional dyspnea has improved, but persists. Blood pressure elevated today,.usually well controlled. Reviewed recent test results with the patient, details below.  ? ? ?Current Outpatient Medications:  ?  albuterol (VENTOLIN HFA) 108 (90 Base) MCG/ACT inhaler, Inhale 1 puff into the lungs daily as needed., Disp: , Rfl:  ?  Fluticasone-Umeclidin-Vilant (TRELEGY ELLIPTA) 200-62.5-25 MCG/ACT AEPB, Inhale 1 puff into the lungs daily., Disp: 60 each, Rfl: 2 ?  Fluticasone-Umeclidin-Vilant (TRELEGY ELLIPTA) 200-62.5-25 MCG/ACT AEPB, Inhale 1 puff into the lungs daily., Disp: 14 each, Rfl: 0 ?  ibuprofen (ADVIL) 800 MG tablet, Take 1 tablet (800 mg total) by mouth every 8 (eight) hours as needed (pain)., Disp: 21 tablet, Rfl: 0 ?  losartan-hydrochlorothiazide (HYZAAR) 100-12.5 MG tablet, Take 1 tablet by mouth daily., Disp: , Rfl:  ?  Multiple Vitamin (MULTIVITAMIN) capsule, Take 1 capsule by mouth daily., Disp: , Rfl:  ?  rosuvastatin (CRESTOR) 10 MG tablet, Take 2 tablets (20 mg total) by mouth daily., Disp: 1 tablet, Rfl: 0 ? ? ? ? ?Cardiovascular and other pertinent studies: ? ?Reviewed external labs and tests, independently interpreted ? ?CT chest lung cancer screening 07/11/2021: ?1. Lung-RADS 2, benign appearance or behavior. Continue annual ?screening with low-dose chest CT without contrast in 12 months. S ?modifier for coronary artery calcifications. ?2. Coronary artery calcifications of the LAD, recommend ASCVD risk ?assessment. ?3. Aortic Atherosclerosis (ICD10-I70.0) and Emphysema (ICD10-J43.9). ?  ? ?Echocardiogram  06/11/2021:  ?Left ventricle cavity is normal in size. Mild concentric hypertrophy of  ?the left ventricle. Normal global wall motion. Normal LV systolic function  ?with EF 53%. Normal diastolic filling pattern.  ?Mild prolapse of the mitral valve leaflets. Mild to moderate mitral  ?regurgitation.  ?Mild tricuspid regurgitation. Estimated pulmonary artery systolic pressure  ?30 mmHg.  ?Mild pulmonic regurgitation. ? ?EKG 06/10/2021: ?Sinus rhythm 70 bpm ?Occasional PAC    ? ?Recent labs: ?05/23/2021: ?Glucose 105, BUN/Cr 13/1.24. EGFR 64. Na/K 139/4.1. Rest of the CMP normal ?H/H 13/39. MCV 86. Platelets 171 ?HbA1C 6.2% ?Chol 238, TG 90, HDL 49, LDL 169 ?TSH 2.1 normal ? ? ? ?Review of Systems  ?Cardiovascular:  Positive for dyspnea on exertion. Negative for chest pain, leg swelling, palpitations and syncope.  ? ?   ? ? ?Vitals:  ? 07/25/21 1323  ?BP: (!) 149/79  ?Pulse: 73  ?Resp: 16  ?Temp: 98 ?F (36.7 ?C)  ?SpO2: 98%  ? ? ?Body mass index is 26.92 kg/m?. ?Filed Weights  ? 07/25/21 1323  ?Weight: 239 lb (108.4 kg)  ? ? ? ?Objective:  ? Physical Exam ?Vitals and nursing note reviewed.  ?Constitutional:   ?   General: He is not in acute distress. ?Neck:  ?   Vascular: No JVD.  ?Cardiovascular:  ?   Rate and Rhythm: Normal rate and regular rhythm.  ?   Heart sounds: Murmur heard.  ?High-pitched blowing mid to late systolic murmur is present with a grade of 3/6 at the apex.  ?Pulmonary:  ?   Effort: Pulmonary effort is normal.  ?   Breath sounds: Normal breath sounds.  No wheezing or rales.  ?Musculoskeletal:  ?   Right lower leg: No edema.  ?   Left lower leg: No edema.  ? ? ? ?   ? ?Visit diagnoses: ?No diagnosis found. ?  ? ?No orders of the defined types were placed in this encounter. ? ? ?Medication changes this visit: ?No orders of the defined types were placed in this encounter. ?  ? ?Assessment & Recommendations:  ? ? ?66 y.o. African American male with hypertension, hyperlipidemia, mitral valve prolapse, LAD  calcification. ? ?Mitral valve prolapse: ?Mild to mod MR, mild TR. ?Repeat echocardiogram in 12-18 months  ? ?LAD calcification: ?Seen on lung cancer screening CT chest. ?Recommend exercise nuclear stress test to assess for ischemia ?Increase Crestor to 40 mg daily.  ?Repeat lipid panel in 3 months ? ? ?Hypertension: ?Reportedly better controlled at home. Monitor for now ? ?Hyperlipidemia: ?LDL 169 mg. Increased Crestor to 40 mg daily. ?Repeat lipid panel in 3 months. ? ? ?F/u after stress test ? ? ?[name], MD ?Pager: [phone] ?Office: [phone] ?

## 2021-08-07 ENCOUNTER — Ambulatory Visit: Payer: Medicare PPO

## 2021-08-07 DIAGNOSIS — R0609 Other forms of dyspnea: Secondary | ICD-10-CM

## 2021-08-13 NOTE — Progress Notes (Signed)
Called patient, NA, LMAM

## 2021-08-14 NOTE — Progress Notes (Signed)
3rd atttempt: Called patient, NA.

## 2021-08-15 ENCOUNTER — Ambulatory Visit: Payer: Medicare PPO | Admitting: Pulmonary Disease

## 2021-08-22 LAB — LIPID PANEL
Chol/HDL Ratio: 2.4 ratio (ref 0.0–5.0)
Cholesterol, Total: 112 mg/dL (ref 100–199)
HDL: 46 mg/dL (ref >39)
LDL Chol Calc (NIH): 51 mg/dL (ref 0–99)
Triglycerides: 71 mg/dL (ref 0–149)
VLDL Cholesterol Cal: 15 mg/dL (ref 5–40)

## 2021-08-23 NOTE — Progress Notes (Signed)
Caled patient, NA, left results on VM.

## 2021-08-29 ENCOUNTER — Ambulatory Visit: Payer: Medicare PPO | Admitting: Cardiology

## 2021-08-29 ENCOUNTER — Encounter: Payer: Self-pay | Admitting: Cardiology

## 2021-08-29 VITALS — BP 127/69 | HR 72 | Temp 97.3°F | Resp 16 | Ht 79.0 in | Wt 237.0 lb

## 2021-08-29 DIAGNOSIS — E782 Mixed hyperlipidemia: Secondary | ICD-10-CM

## 2021-08-29 DIAGNOSIS — I10 Essential (primary) hypertension: Secondary | ICD-10-CM

## 2021-08-29 MED ORDER — ROSUVASTATIN CALCIUM 40 MG PO TABS: 40.0000 mg | ORAL_TABLET | Freq: Every day | ORAL | 3 refills | Status: AC

## 2021-08-29 NOTE — Progress Notes (Signed)
Patient referred by Audley Hose, MD for dyspnea on exertion, murmur  Subjective:   Mark Novak, male    DOB: 21-Oct-1955, 66 y.o.   MRN: [id number]   Chief Complaint  Patient presents with   Hypertension   Follow-up    4-6 week     HPI  66 y.o. African American male with hypertension, hyperlipidemia, mitral valve prolapse, LAD calcification.  Patient is doing well, no complaints today. Blood pressure usually well controlled.    Current Outpatient Medications:    albuterol (VENTOLIN HFA) 108 (90 Base) MCG/ACT inhaler, Inhale 1 puff into the lungs daily as needed., Disp: , Rfl:    Cranberry-Vitamin C (CRANBERRY CONCENTRATE/VITAMINC PO), Take by mouth., Disp: , Rfl:    Ferrous Sulfate (IRON PO), Take by mouth., Disp: , Rfl:    Fluticasone-Umeclidin-Vilant (TRELEGY ELLIPTA) 200-62.5-25 MCG/ACT AEPB, Inhale 1 puff into the lungs daily., Disp: 60 each, Rfl: 2   Fluticasone-Umeclidin-Vilant (TRELEGY ELLIPTA) 200-62.5-25 MCG/ACT AEPB, Inhale 1 puff into the lungs daily., Disp: 14 each, Rfl: 0   lisinopril (ZESTRIL) 20 MG tablet, Take 20 mg by mouth daily., Disp: , Rfl:    Multiple Vitamin (MULTIVITAMIN) capsule, Take 1 capsule by mouth daily., Disp: , Rfl:    Nutritional Supplements (PROSTATE PO), Take by mouth., Disp: , Rfl:    Omega-3 Fatty Acids (FISH OIL PO), Take by mouth., Disp: , Rfl:    tiZANidine (ZANAFLEX) 4 MG tablet, , Disp: , Rfl:    rosuvastatin (CRESTOR) 40 MG tablet, Take 1 tablet (40 mg total) by mouth daily., Disp: 90 tablet, Rfl: 3     Cardiovascular and other pertinent studies:  Reviewed external labs and tests, independently interpreted  Exercise Myoview stress test 08/07/2021: Exercise nuclear stress test was performed using Bruce protocol.   1 Day Rest and Stress images. Exercise time 5 minutes 30 seconds, achieved 6.51 METS, 93% APMHR.  Stress ECG negative for ischemia. Hypertensive response to exercise (resting BP 130/80, peak BP 200/80). Normal  myocardial perfusion without evidence of reversible ischemia or prior infarct. Left ventricular size dilated, wall motion preserved, calculated LVEF 66% Low risk study.    CT chest lung cancer screening 07/11/2021: 1. Lung-RADS 2, benign appearance or behavior. Continue annual screening with low-dose chest CT without contrast in 12 months. S modifier for coronary artery calcifications. 2. Coronary artery calcifications of the LAD, recommend ASCVD risk assessment. 3. Aortic Atherosclerosis (ICD10-I70.0) and Emphysema (ICD10-J43.9).    Echocardiogram 06/11/2021:  Left ventricle cavity is normal in size. Mild concentric hypertrophy of  the left ventricle. Normal global wall motion. Normal LV systolic function  with EF 53%. Normal diastolic filling pattern.  Mild prolapse of the mitral valve leaflets. Mild to moderate mitral  regurgitation.  Mild tricuspid regurgitation. Estimated pulmonary artery systolic pressure  30 mmHg.  Mild pulmonic regurgitation.  EKG 06/10/2021: Sinus rhythm 70 bpm Occasional PAC     Recent labs: 08/21/2021: Chol 112, TG 71, HDL 46, LDL 51  05/23/2021: Glucose 105, BUN/Cr 13/1.24. EGFR 64. Na/K 139/4.1. Rest of the CMP normal H/H 13/39. MCV 86. Platelets 171 HbA1C 6.2% Chol 238, TG 90, HDL 49, LDL 169 TSH 2.1 normal    Review of Systems  Cardiovascular:  Positive for dyspnea on exertion. Negative for chest pain, leg swelling, palpitations and syncope.        Vitals:   08/29/21 0934  BP: (!) 146/80  Pulse: 80  Resp: 16  Temp: (!) 97.3 F (36.3 C)    Body mass  index is 26.7 kg/m. Filed Weights   08/29/21 0934  Weight: 237 lb (107.5 kg)     Objective:   Physical Exam Vitals and nursing note reviewed.  Constitutional:      General: He is not in acute distress. Neck:     Vascular: No JVD.  Cardiovascular:     Rate and Rhythm: Normal rate and regular rhythm.     Heart sounds: Murmur heard.  High-pitched blowing mid to late systolic  murmur is present with a grade of 3/6 at the apex.  Pulmonary:     Effort: Pulmonary effort is normal.     Breath sounds: Normal breath sounds. No wheezing or rales.  Musculoskeletal:     Right lower leg: No edema.     Left lower leg: No edema.         Visit diagnoses:   ICD-10-CM   1. Essential hypertension  I10     2. Mixed hyperlipidemia  E78.2 rosuvastatin (CRESTOR) 40 MG tablet         Assessment & Recommendations:    66 y.o. African American male with hypertension, hyperlipidemia, mitral valve prolapse, LAD calcification.  Mitral valve prolapse: Mild to mod MR, mild TR. Repeat echocardiogram in 12-18 months   LAD calcification: Seen on lung cancer screening CT chest (06/2021). No ischemia on exercise nuclear stress test (07/2021) See below re: lipid management  Hypertension: Well controlled at home. Monitor for now. If SBP remains consistently >140 mmHg, could add amlodipine 5 mg daily.  Hyperlipidemia: LDL down to 51 on Crestor to 40 mg daily.  Continue the same  F/u in 1 year   Mark Mormon, MD Pager: [phone] Office: [phone]

## 2021-09-26 ENCOUNTER — Ambulatory Visit (HOSPITAL_COMMUNITY)
Admission: EM | Admit: 2021-09-26 | Discharge: 2021-09-26 | Disposition: A | Discharge status: Home / Self Care | Payer: Medicare PPO | Attending: Urgent Care | Admitting: Urgent Care

## 2021-09-26 ENCOUNTER — Encounter (HOSPITAL_COMMUNITY): Payer: Self-pay | Admitting: Emergency Medicine

## 2021-09-26 ENCOUNTER — Ambulatory Visit (INDEPENDENT_AMBULATORY_CARE_PROVIDER_SITE_OTHER): Payer: Medicare PPO

## 2021-09-26 DIAGNOSIS — R072 Precordial pain: Secondary | ICD-10-CM | POA: Diagnosis not present

## 2021-09-26 DIAGNOSIS — M25512 Pain in left shoulder: Secondary | ICD-10-CM

## 2021-09-26 DIAGNOSIS — M542 Cervicalgia: Secondary | ICD-10-CM | POA: Diagnosis not present

## 2021-09-26 DIAGNOSIS — M545 Low back pain, unspecified: Secondary | ICD-10-CM

## 2021-09-26 DIAGNOSIS — M7918 Myalgia, other site: Secondary | ICD-10-CM

## 2021-09-26 MED ORDER — OXYCODONE-ACETAMINOPHEN 5-325 MG PO TABS: 1.0000 | ORAL_TABLET | Freq: Four times a day (QID) | ORAL | 0 refills | Status: AC | PRN

## 2021-09-26 MED ORDER — HYDROCODONE-ACETAMINOPHEN 5-325 MG PO TABS
ORAL_TABLET | ORAL | Status: AC
  Filled 2021-09-26: qty 1

## 2021-09-26 MED ORDER — IBUPROFEN 600 MG PO TABS: 600.0000 mg | ORAL_TABLET | Freq: Four times a day (QID) | ORAL | 0 refills | Status: AC | PRN

## 2021-09-26 MED ORDER — HYDROCODONE-ACETAMINOPHEN 5-325 MG PO TABS
1.0000 | ORAL_TABLET | Freq: Once | ORAL | Status: AC
  Administered 2021-09-26: 1 via ORAL

## 2021-09-26 MED ORDER — CARISOPRODOL 350 MG PO TABS: 350.0000 mg | ORAL_TABLET | Freq: Three times a day (TID) | ORAL | 0 refills | Status: DC
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
## 2021-09-26 NOTE — Discharge Instructions (Addendum)
You xrays show significant degeneration and osteoarthritis of your spine, but no acute fractures or dislocations. Your shoulder x-ray was also negative for any acute changes. Please take ibuprofen 600 mg every 6 hours for mild to moderate pain.  Please take this with food. Wear the sling for comfort, roughly 8 hours daily. You must move your shoulder to prevent frozen shoulder. Take the pain medication for severe pain only.  This can cause sedation or constipation with long-term use. Take the muscle relaxer on an as-needed basis.  This medication also may cause drowsiness.  Do not drive while taking these medications. Please call your PCP and schedule follow-up on Monday or Tuesday of next week. Your EKG is normal. Alternate ice and heat to the affected areas of your neck, chest and back. For any new or worsening symptoms, please head directly to the ER.  If you develop any numbness or tingling down your legs, or any numbness in your groin, or inability to hold your bowels or urine, please had directly to the emergency room.

## 2021-11-15 ENCOUNTER — Other Ambulatory Visit: Payer: Self-pay | Admitting: Pulmonary Disease

## 2022-02-14 ENCOUNTER — Other Ambulatory Visit: Payer: Self-pay | Admitting: Family Medicine

## 2022-02-14 DIAGNOSIS — N183 Chronic kidney disease, stage 3 unspecified: Secondary | ICD-10-CM

## 2022-02-18 ENCOUNTER — Ambulatory Visit
Admission: RE | Admit: 2022-02-18 | Discharge: 2022-02-18 | Disposition: A | Discharge status: Home / Self Care | Payer: Medicare PPO | Source: Ambulatory Visit | Attending: Family Medicine | Admitting: Family Medicine

## 2022-02-18 DIAGNOSIS — N183 Chronic kidney disease, stage 3 unspecified: Secondary | ICD-10-CM

## 2022-03-04 ENCOUNTER — Other Ambulatory Visit: Payer: Self-pay | Admitting: Family Medicine

## 2022-03-04 DIAGNOSIS — N183 Chronic kidney disease, stage 3 unspecified: Secondary | ICD-10-CM

## 2022-06-11 ENCOUNTER — Encounter: Payer: Self-pay | Admitting: *Deleted

## 2022-06-19 ENCOUNTER — Other Ambulatory Visit: Payer: Self-pay | Admitting: Family Medicine

## 2022-06-19 DIAGNOSIS — Z122 Encounter for screening for malignant neoplasm of respiratory organs: Secondary | ICD-10-CM

## 2022-07-22 ENCOUNTER — Ambulatory Visit
Admission: RE | Admit: 2022-07-22 | Discharge: 2022-07-22 | Disposition: A | Discharge status: Home / Self Care | Payer: Medicare HMO | Source: Ambulatory Visit | Attending: Family Medicine | Admitting: Family Medicine

## 2022-07-22 DIAGNOSIS — Z122 Encounter for screening for malignant neoplasm of respiratory organs: Secondary | ICD-10-CM

## 2022-08-06 ENCOUNTER — Encounter: Payer: Self-pay | Admitting: Pulmonary Disease

## 2022-08-06 ENCOUNTER — Ambulatory Visit: Payer: Medicare HMO | Admitting: Pulmonary Disease

## 2022-08-06 VITALS — BP 126/74 | HR 77 | Temp 98.3°F | Ht 79.0 in | Wt 236.0 lb

## 2022-08-06 DIAGNOSIS — R0602 Shortness of breath: Secondary | ICD-10-CM | POA: Diagnosis not present

## 2022-08-06 DIAGNOSIS — G4733 Obstructive sleep apnea (adult) (pediatric): Secondary | ICD-10-CM

## 2022-08-06 DIAGNOSIS — J439 Emphysema, unspecified: Secondary | ICD-10-CM | POA: Diagnosis not present

## 2022-08-06 NOTE — Patient Instructions (Signed)
Will order home sleep study for evaluation of sleep apnea Schedule PFTs in 3 months Continue Trelegy inhaler Follow-up in 3 months after PFTs
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
## 2022-08-29 ENCOUNTER — Ambulatory Visit: Payer: Medicare HMO | Admitting: Internal Medicine

## 2022-08-29 ENCOUNTER — Ambulatory Visit: Payer: Medicare PPO | Admitting: Cardiology

## 2022-09-12 ENCOUNTER — Ambulatory Visit
Admission: RE | Admit: 2022-09-12 | Discharge: 2022-09-12 | Disposition: A | Discharge status: Home / Self Care | Payer: Medicare HMO | Source: Ambulatory Visit | Attending: Family Medicine | Admitting: Family Medicine

## 2022-09-12 DIAGNOSIS — N183 Chronic kidney disease, stage 3 unspecified: Secondary | ICD-10-CM

## 2022-09-22 ENCOUNTER — Other Ambulatory Visit: Payer: Self-pay | Admitting: Family Medicine

## 2022-09-22 DIAGNOSIS — N183 Chronic kidney disease, stage 3 unspecified: Secondary | ICD-10-CM

## 2022-09-25 ENCOUNTER — Encounter: Payer: Self-pay | Admitting: Family Medicine

## 2022-09-26 ENCOUNTER — Encounter: Payer: Self-pay | Admitting: Family Medicine

## 2022-09-26 ENCOUNTER — Other Ambulatory Visit: Payer: Self-pay | Admitting: Family Medicine

## 2022-09-26 ENCOUNTER — Ambulatory Visit
Admission: RE | Admit: 2022-09-26 | Discharge: 2022-09-26 | Disposition: A | Discharge status: Home / Self Care | Payer: Medicare HMO | Source: Ambulatory Visit | Attending: Family Medicine | Admitting: Family Medicine

## 2022-09-26 DIAGNOSIS — N183 Chronic kidney disease, stage 3 unspecified: Secondary | ICD-10-CM

## 2022-09-29 ENCOUNTER — Ambulatory Visit: Payer: Medicare HMO | Admitting: Cardiology

## 2022-09-29 ENCOUNTER — Other Ambulatory Visit: Payer: Self-pay | Admitting: Family Medicine

## 2022-09-29 DIAGNOSIS — N183 Chronic kidney disease, stage 3 unspecified: Secondary | ICD-10-CM

## 2022-12-29 ENCOUNTER — Other Ambulatory Visit (HOSPITAL_COMMUNITY): Payer: Self-pay | Admitting: Family Medicine

## 2022-12-29 DIAGNOSIS — I4510 Unspecified right bundle-branch block: Secondary | ICD-10-CM

## 2023-01-07 ENCOUNTER — Ambulatory Visit (HOSPITAL_COMMUNITY): Payer: Medicare HMO

## 2023-01-07 ENCOUNTER — Encounter (HOSPITAL_COMMUNITY): Payer: Self-pay

## 2023-12-16 DIAGNOSIS — M170 Bilateral primary osteoarthritis of knee: Secondary | ICD-10-CM | POA: Diagnosis not present

## 2023-12-29 DIAGNOSIS — I10 Essential (primary) hypertension: Secondary | ICD-10-CM | POA: Diagnosis not present

## 2023-12-29 DIAGNOSIS — E785 Hyperlipidemia, unspecified: Secondary | ICD-10-CM | POA: Diagnosis not present

## 2024-01-01 DIAGNOSIS — E1122 Type 2 diabetes mellitus with diabetic chronic kidney disease: Secondary | ICD-10-CM | POA: Diagnosis not present

## 2024-01-01 DIAGNOSIS — I10 Essential (primary) hypertension: Secondary | ICD-10-CM | POA: Diagnosis not present

## 2024-01-01 DIAGNOSIS — I340 Nonrheumatic mitral (valve) insufficiency: Secondary | ICD-10-CM | POA: Diagnosis not present

## 2024-01-01 DIAGNOSIS — E782 Mixed hyperlipidemia: Secondary | ICD-10-CM | POA: Diagnosis not present

## 2024-01-05 ENCOUNTER — Other Ambulatory Visit (HOSPITAL_COMMUNITY): Payer: Self-pay | Admitting: Family Medicine

## 2024-01-05 ENCOUNTER — Other Ambulatory Visit (HOSPITAL_BASED_OUTPATIENT_CLINIC_OR_DEPARTMENT_OTHER): Payer: Self-pay | Admitting: Family Medicine

## 2024-01-05 DIAGNOSIS — Z23 Encounter for immunization: Secondary | ICD-10-CM | POA: Diagnosis not present

## 2024-01-05 DIAGNOSIS — Z0001 Encounter for general adult medical examination with abnormal findings: Secondary | ICD-10-CM | POA: Diagnosis not present

## 2024-01-05 DIAGNOSIS — Z13820 Encounter for screening for osteoporosis: Secondary | ICD-10-CM | POA: Diagnosis not present

## 2024-01-05 DIAGNOSIS — Z122 Encounter for screening for malignant neoplasm of respiratory organs: Secondary | ICD-10-CM

## 2024-01-05 DIAGNOSIS — I4510 Unspecified right bundle-branch block: Secondary | ICD-10-CM | POA: Diagnosis not present

## 2024-01-05 DIAGNOSIS — R011 Cardiac murmur, unspecified: Secondary | ICD-10-CM | POA: Diagnosis not present

## 2024-01-05 DIAGNOSIS — E1165 Type 2 diabetes mellitus with hyperglycemia: Secondary | ICD-10-CM | POA: Diagnosis not present

## 2024-01-05 DIAGNOSIS — N183 Chronic kidney disease, stage 3 unspecified: Secondary | ICD-10-CM | POA: Diagnosis not present

## 2024-01-05 DIAGNOSIS — I10 Essential (primary) hypertension: Secondary | ICD-10-CM | POA: Diagnosis not present

## 2024-01-05 DIAGNOSIS — J438 Other emphysema: Secondary | ICD-10-CM | POA: Diagnosis not present

## 2024-01-05 DIAGNOSIS — J449 Chronic obstructive pulmonary disease, unspecified: Secondary | ICD-10-CM | POA: Diagnosis not present
